# Patient Record
Sex: Female | Born: 1945 | Race: White | Hispanic: No | Marital: Married | Smoking: Never smoker
Health system: Southern US, Community
[De-identification: ages and names within clinical notes are randomized; demographics above are authoritative.]

## PROBLEM LIST (undated history)

## (undated) DIAGNOSIS — I639 Cerebral infarction, unspecified: Secondary | ICD-10-CM

## (undated) DIAGNOSIS — F200 Paranoid schizophrenia: Secondary | ICD-10-CM

## (undated) DIAGNOSIS — E7800 Pure hypercholesterolemia, unspecified: Secondary | ICD-10-CM

## (undated) DIAGNOSIS — I10 Essential (primary) hypertension: Secondary | ICD-10-CM

## (undated) DIAGNOSIS — S46009A Unspecified injury of muscle(s) and tendon(s) of the rotator cuff of unspecified shoulder, initial encounter: Secondary | ICD-10-CM

## (undated) DIAGNOSIS — C50919 Malignant neoplasm of unspecified site of unspecified female breast: Secondary | ICD-10-CM

## (undated) DIAGNOSIS — F32A Depression, unspecified: Secondary | ICD-10-CM

## (undated) DIAGNOSIS — F329 Major depressive disorder, single episode, unspecified: Secondary | ICD-10-CM

## (undated) HISTORY — DX: Cerebral infarction, unspecified: I63.9

## (undated) HISTORY — PX: MASTECTOMY: SHX3

---

## 2014-04-11 ENCOUNTER — Emergency Department (HOSPITAL_COMMUNITY): Payer: Medicare Other

## 2014-04-11 ENCOUNTER — Emergency Department (HOSPITAL_COMMUNITY)
Admission: EM | Admit: 2014-04-11 | Discharge: 2014-04-11 | Disposition: A | Discharge status: Home / Self Care | Payer: Medicare Other | Attending: Emergency Medicine | Admitting: Emergency Medicine

## 2014-04-11 ENCOUNTER — Encounter (HOSPITAL_COMMUNITY): Payer: Self-pay | Admitting: Emergency Medicine

## 2014-04-11 DIAGNOSIS — R410 Disorientation, unspecified: Secondary | ICD-10-CM | POA: Insufficient documentation

## 2014-04-11 DIAGNOSIS — Z8659 Personal history of other mental and behavioral disorders: Secondary | ICD-10-CM | POA: Insufficient documentation

## 2014-04-11 DIAGNOSIS — N390 Urinary tract infection, site not specified: Secondary | ICD-10-CM

## 2014-04-11 DIAGNOSIS — Z853 Personal history of malignant neoplasm of breast: Secondary | ICD-10-CM | POA: Insufficient documentation

## 2014-04-11 DIAGNOSIS — Z8639 Personal history of other endocrine, nutritional and metabolic disease: Secondary | ICD-10-CM | POA: Diagnosis not present

## 2014-04-11 DIAGNOSIS — I10 Essential (primary) hypertension: Secondary | ICD-10-CM | POA: Diagnosis not present

## 2014-04-11 DIAGNOSIS — R4182 Altered mental status, unspecified: Secondary | ICD-10-CM | POA: Diagnosis present

## 2014-04-11 HISTORY — DX: Major depressive disorder, single episode, unspecified: F32.9

## 2014-04-11 HISTORY — DX: Unspecified injury of muscle(s) and tendon(s) of the rotator cuff of unspecified shoulder, initial encounter: S46.009A

## 2014-04-11 HISTORY — DX: Pure hypercholesterolemia, unspecified: E78.00

## 2014-04-11 HISTORY — DX: Malignant neoplasm of unspecified site of unspecified female breast: C50.919

## 2014-04-11 HISTORY — DX: Paranoid schizophrenia: F20.0

## 2014-04-11 HISTORY — DX: Depression, unspecified: F32.A

## 2014-04-11 HISTORY — DX: Essential (primary) hypertension: I10

## 2014-04-11 LAB — URINE MICROSCOPIC-ADD ON

## 2014-04-11 LAB — CBG MONITORING, ED: Glucose-Capillary: 98 mg/dL (ref 70–99)

## 2014-04-11 LAB — BASIC METABOLIC PANEL
Anion gap: 10 (ref 5–15)
BUN: 24 mg/dL — ABNORMAL HIGH (ref 6–23)
CO2: 25 mmol/L (ref 19–32)
Calcium: 9 mg/dL (ref 8.4–10.5)
Chloride: 102 mmol/L (ref 96–112)
Creatinine, Ser: 1.29 mg/dL — ABNORMAL HIGH (ref 0.50–1.10)
GFR calc Af Amer: 48 mL/min — ABNORMAL LOW (ref >90)
GFR calc non Af Amer: 41 mL/min — ABNORMAL LOW (ref >90)
Glucose, Bld: 97 mg/dL (ref 70–99)
Potassium: 4.5 mmol/L (ref 3.5–5.1)
Sodium: 137 mmol/L (ref 135–145)

## 2014-04-11 LAB — CBC WITH DIFFERENTIAL/PLATELET
Basophils Absolute: 0.1 10*3/uL (ref 0.0–0.1)
Basophils Relative: 1 % (ref 0–1)
Eosinophils Absolute: 0.1 10*3/uL (ref 0.0–0.7)
Eosinophils Relative: 2 % (ref 0–5)
HCT: 39.8 % (ref 36.0–46.0)
Hemoglobin: 13.5 g/dL (ref 12.0–15.0)
Lymphocytes Relative: 13 % (ref 12–46)
Lymphs Abs: 1.2 10*3/uL (ref 0.7–4.0)
MCH: 29.2 pg (ref 26.0–34.0)
MCHC: 33.9 g/dL (ref 30.0–36.0)
MCV: 86.1 fL (ref 78.0–100.0)
Monocytes Absolute: 0.5 10*3/uL (ref 0.1–1.0)
Monocytes Relative: 6 % (ref 3–12)
Neutro Abs: 7 10*3/uL (ref 1.7–7.7)
Neutrophils Relative %: 78 % — ABNORMAL HIGH (ref 43–77)
Platelets: 469 10*3/uL — ABNORMAL HIGH (ref 150–400)
RBC: 4.62 MIL/uL (ref 3.87–5.11)
RDW: 15.5 % (ref 11.5–15.5)
WBC: 8.8 10*3/uL (ref 4.0–10.5)

## 2014-04-11 LAB — URINALYSIS, ROUTINE W REFLEX MICROSCOPIC
Bilirubin Urine: NEGATIVE
Glucose, UA: NEGATIVE mg/dL
Hgb urine dipstick: NEGATIVE
Ketones, ur: 15 mg/dL — AB
Nitrite: NEGATIVE
Protein, ur: NEGATIVE mg/dL
Specific Gravity, Urine: 1.016 (ref 1.005–1.030)
Urobilinogen, UA: 0.2 mg/dL (ref 0.0–1.0)
pH: 6.5 (ref 5.0–8.0)

## 2014-04-11 LAB — LACTIC ACID, PLASMA: Lactic Acid, Venous: 0.9 mmol/L (ref 0.5–2.0)

## 2014-04-11 MED ORDER — CEPHALEXIN 500 MG PO CAPS: 500.0000 mg | ORAL_CAPSULE | Freq: Four times a day (QID) | ORAL | Status: DC

## 2014-04-11 MED ORDER — CEPHALEXIN 250 MG PO CAPS
500.0000 mg | ORAL_CAPSULE | Freq: Once | ORAL | Status: AC
  Administered 2014-04-11: 500 mg via ORAL
  Filled 2014-04-11: qty 2

## 2014-04-11 NOTE — ED Notes (Signed)
Patient's husband brought patient in today for altered mental status and unsteady gait.   Patient was started on new medications on Wed for rotator cuff issues, and started having problems approximately 1 1/2 - 2 hours later.   Patient's husband states patient became disoriented, confused, and had additional problems with gait.   Husband advised "she has never been real steady on her feet, but she is additionally unsteady now".   Patient alert and oriented to person, and place only.

## 2014-04-11 NOTE — ED Notes (Signed)
Pt placed on bedpan with no success.

## 2014-04-11 NOTE — Discharge Instructions (Signed)

## 2014-04-11 NOTE — ED Provider Notes (Signed)
CSN: [id number]     Arrival date & time 04/11/14  4193 History   First MD Initiated Contact with Patient 04/11/14 [phone]     Chief Complaint  Patient presents with  . Altered Mental Status     (Consider location/radiation/quality/duration/timing/severity/associated sxs/prior Treatment) HPI   68yF with changes in mental status. Hx primarily from husband. First noticed around lunch time on Wednesday. Forgetful. Taking evening medications in morning which is unusual. This morning tried putting underwear on over her pants. Started on meloxicam and tramadol on Monday for L shoulder pain. Husband felt this may be SE of medications so has not taken either since Wednesday but symptoms have persisted. No recent medication changes otherwise. On sedating medication for psychiatric reasons and normally sleeps 10-12 hours. Last night did not sleep much at all and kept getting up. Normally somewhat unsteady at baseline but in past two days "has been unsteadier." No recent fall/trauma though. Appetite has been fine. No v/d. No fever. No urinary complaints. Pt has not voiced recent complaints aside from L shoulder pain.   Past Medical History  Diagnosis Date  . Hypertension   . Depression   . Paranoid schizophrenia   . Breast cancer   . Rotator cuff injury     left  . Hypercholesterolemia    Past Surgical History  Procedure Laterality Date  . Mastectomy     No family history on file. History  Substance Use Topics  . Smoking status: Never Smoker   . Smokeless tobacco: Not on file  . Alcohol Use: No   OB History    No data available     Review of Systems  All systems reviewed and negative, other than as noted in HPI.   Allergies  Review of patient's allergies indicates no known allergies.  Home Medications   Prior to Admission medications   Not on File   BP 160/75 mmHg  Pulse 85  Temp(Src) 97.5 F (36.4 C) (Oral)  Resp 16  SpO2 97% Physical Exam  Constitutional: She appears  well-developed and well-nourished. No distress.  HENT:  Head: Normocephalic and atraumatic.  Eyes: Conjunctivae and EOM are normal. Pupils are equal, round, and reactive to light. Right eye exhibits no discharge. Left eye exhibits no discharge.  Neck: Neck supple.  No nuchal rigidity  Cardiovascular: Normal rate, regular rhythm and normal heart sounds.  Exam reveals no gallop and no friction rub.   No murmur heard. Pulmonary/Chest: Effort normal and breath sounds normal. No respiratory distress.  Abdominal: Soft. She exhibits no distension. There is no tenderness.  Musculoskeletal: She exhibits no edema or tenderness.  Neurological: She is alert.  Disoriented to time. Speech is slow but understandable. Looks to her husband to answer questions but will answer herself when being very direct. Follows commands. CN 2-12 intact. Intention tremor but husband reports not new. Strength 5/5 b/l u/l extremities.   Skin: Skin is warm and dry.  Psychiatric: She has a normal mood and affect. Her behavior is normal. Thought content normal.  Nursing note and vitals reviewed.   ED Course  Procedures (including critical care time) Labs Review Labs Reviewed  CBC WITH DIFFERENTIAL/PLATELET - Abnormal; Notable for the following:    Platelets 469 (*)    Neutrophils Relative % 78 (*)    All other components within normal limits  BASIC METABOLIC PANEL - Abnormal; Notable for the following:    BUN 24 (*)    Creatinine, Ser 1.29 (*)    GFR calc non  Af Amer 41 (*)    GFR calc Af Amer 48 (*)    All other components within normal limits  URINALYSIS, ROUTINE W REFLEX MICROSCOPIC - Abnormal; Notable for the following:    Ketones, ur 15 (*)    Leukocytes, UA MODERATE (*)    All other components within normal limits  URINE MICROSCOPIC-ADD ON - Abnormal; Notable for the following:    Squamous Epithelial / LPF FEW (*)    Bacteria, UA FEW (*)    Casts HYALINE CASTS (*)    All other components within normal  limits  URINE CULTURE  LACTIC ACID, PLASMA    Imaging Review Ct Head Wo Contrast  04/11/2014   CLINICAL DATA:  Altered mental status. Unsteady gait. Medication change 2 days ago.  EXAM: CT HEAD WITHOUT CONTRAST  TECHNIQUE: Contiguous axial images were obtained from the base of the skull through the vertex without intravenous contrast.  COMPARISON:  None.  FINDINGS: The brain does not show generalized atrophy. The brainstem and cerebellum are normal. There are a few areas of low density in the basal ganglia consistent with old small vessel infarctions. No sign of acute infarction, mass lesion, hemorrhage, hydrocephalus or extra-axial collection. The calvarium is unremarkable. Sinuses, middle ears and mastoids are clear.  IMPRESSION: No acute finding. 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   Electronically Signed   By: Nelson Chimes M.D.   On: 04/11/2014 10:46     EKG Interpretation   Date/Time:  Friday April 11 2014 09:43:22 EST Ventricular Rate:  86 PR Interval:  161 QRS Duration: 88 QT Interval:  380 QTC Calculation: 454 R Axis:   71 Text Interpretation:  Sinus rhythm Poor R wave progression No old tracing  to compare Confirmed by Adel  MD, Gaddiel Cullens ([id number]) on 04/11/2014 9:50:16 AM      MDM   Final diagnoses:  Disorientation  UTI (lower urinary tract infection)    68yF with change in mentation. Afebrile. HD stable. Potentially UTI?  Few bacteria on UA but also some epithelial cell. Moderate leuks, 7-10 WBC. No specific urinary complaints, but with change in mental status, will prescribe abx. Urine culture. No focal neuro deficit.  Potentially medication reaction/interaction. Recently started on tramadol/mobic for L shoulder pain. If anything, possibly additional of tramadol. Recommended stopping for another few days.    Virgel Manifold, MD 04/12/14 [phone]

## 2014-04-12 LAB — URINE CULTURE
Colony Count: NO GROWTH
Culture: NO GROWTH

## 2016-07-09 ENCOUNTER — Observation Stay (HOSPITAL_COMMUNITY): Payer: Medicare Other

## 2016-07-09 ENCOUNTER — Observation Stay (HOSPITAL_COMMUNITY)
Admission: EM | Admit: 2016-07-09 | Discharge: 2016-07-10 | Disposition: A | Discharge status: Home / Self Care | Payer: Medicare Other | Attending: Family Medicine | Admitting: Family Medicine

## 2016-07-09 ENCOUNTER — Encounter (HOSPITAL_COMMUNITY): Payer: Self-pay | Admitting: Emergency Medicine

## 2016-07-09 DIAGNOSIS — R4781 Slurred speech: Secondary | ICD-10-CM | POA: Insufficient documentation

## 2016-07-09 DIAGNOSIS — G459 Transient cerebral ischemic attack, unspecified: Principal | ICD-10-CM | POA: Insufficient documentation

## 2016-07-09 DIAGNOSIS — I10 Essential (primary) hypertension: Secondary | ICD-10-CM | POA: Insufficient documentation

## 2016-07-09 DIAGNOSIS — Z8673 Personal history of transient ischemic attack (TIA), and cerebral infarction without residual deficits: Secondary | ICD-10-CM | POA: Diagnosis not present

## 2016-07-09 DIAGNOSIS — Z9010 Acquired absence of unspecified breast and nipple: Secondary | ICD-10-CM | POA: Insufficient documentation

## 2016-07-09 DIAGNOSIS — E7800 Pure hypercholesterolemia, unspecified: Secondary | ICD-10-CM | POA: Diagnosis not present

## 2016-07-09 DIAGNOSIS — R29810 Facial weakness: Secondary | ICD-10-CM | POA: Insufficient documentation

## 2016-07-09 DIAGNOSIS — Z79899 Other long term (current) drug therapy: Secondary | ICD-10-CM | POA: Insufficient documentation

## 2016-07-09 DIAGNOSIS — Z853 Personal history of malignant neoplasm of breast: Secondary | ICD-10-CM | POA: Diagnosis not present

## 2016-07-09 DIAGNOSIS — F32A Depression, unspecified: Secondary | ICD-10-CM

## 2016-07-09 DIAGNOSIS — F329 Major depressive disorder, single episode, unspecified: Secondary | ICD-10-CM | POA: Diagnosis not present

## 2016-07-09 DIAGNOSIS — Z7982 Long term (current) use of aspirin: Secondary | ICD-10-CM | POA: Diagnosis not present

## 2016-07-09 DIAGNOSIS — F200 Paranoid schizophrenia: Secondary | ICD-10-CM | POA: Diagnosis present

## 2016-07-09 DIAGNOSIS — R200 Anesthesia of skin: Secondary | ICD-10-CM | POA: Diagnosis not present

## 2016-07-09 LAB — COMPREHENSIVE METABOLIC PANEL
ALT: 20 U/L (ref 14–54)
ANION GAP: 7 (ref 5–15)
AST: 21 U/L (ref 15–41)
Albumin: 3.5 g/dL (ref 3.5–5.0)
Alkaline Phosphatase: 79 U/L (ref 38–126)
BUN: 15 mg/dL (ref 6–20)
CHLORIDE: 104 mmol/L (ref 101–111)
CO2: 26 mmol/L (ref 22–32)
CREATININE: 1.1 mg/dL — AB (ref 0.44–1.00)
Calcium: 8.8 mg/dL — ABNORMAL LOW (ref 8.9–10.3)
GFR calc non Af Amer: 49 mL/min — ABNORMAL LOW (ref >60)
GFR, EST AFRICAN AMERICAN: 57 mL/min — AB (ref >60)
Glucose, Bld: 99 mg/dL (ref 65–99)
POTASSIUM: 4.1 mmol/L (ref 3.5–5.1)
SODIUM: 137 mmol/L (ref 135–145)
Total Bilirubin: 0.5 mg/dL (ref 0.3–1.2)
Total Protein: 6.7 g/dL (ref 6.5–8.1)

## 2016-07-09 LAB — I-STAT CHEM 8, ED
BUN: 18 mg/dL (ref 6–20)
Calcium, Ion: 1.13 mmol/L — ABNORMAL LOW (ref 1.15–1.40)
Chloride: 101 mmol/L (ref 101–111)
Creatinine, Ser: 1.2 mg/dL — ABNORMAL HIGH (ref 0.44–1.00)
Glucose, Bld: 95 mg/dL (ref 65–99)
HEMATOCRIT: 44 % (ref 36.0–46.0)
HEMOGLOBIN: 15 g/dL (ref 12.0–15.0)
POTASSIUM: 4.2 mmol/L (ref 3.5–5.1)
SODIUM: 140 mmol/L (ref 135–145)
TCO2: 29 mmol/L (ref 0–100)

## 2016-07-09 LAB — PROTIME-INR
INR: 1.11
PROTHROMBIN TIME: 14.3 s (ref 11.4–15.2)

## 2016-07-09 LAB — CBC
HCT: 42.3 % (ref 36.0–46.0)
Hemoglobin: 13.8 g/dL (ref 12.0–15.0)
MCH: 27.8 pg (ref 26.0–34.0)
MCHC: 32.6 g/dL (ref 30.0–36.0)
MCV: 85.1 fL (ref 78.0–100.0)
PLATELETS: 535 10*3/uL — AB (ref 150–400)
RBC: 4.97 MIL/uL (ref 3.87–5.11)
RDW: 16.5 % — AB (ref 11.5–15.5)
WBC: 9.3 10*3/uL (ref 4.0–10.5)

## 2016-07-09 LAB — DIFFERENTIAL
BASOS PCT: 1 %
Basophils Absolute: 0.1 10*3/uL (ref 0.0–0.1)
Eosinophils Absolute: 0.1 10*3/uL (ref 0.0–0.7)
Eosinophils Relative: 1 %
Lymphocytes Relative: 17 %
Lymphs Abs: 1.6 10*3/uL (ref 0.7–4.0)
MONO ABS: 0.5 10*3/uL (ref 0.1–1.0)
Monocytes Relative: 6 %
NEUTROS ABS: 7 10*3/uL (ref 1.7–7.7)
Neutrophils Relative %: 75 %

## 2016-07-09 LAB — I-STAT TROPONIN, ED: Troponin i, poc: 0.01 ng/mL (ref 0.00–0.08)

## 2016-07-09 LAB — APTT: aPTT: 32 seconds (ref 24–36)

## 2016-07-09 LAB — CBG MONITORING, ED: Glucose-Capillary: 101 mg/dL — ABNORMAL HIGH (ref 65–99)

## 2016-07-09 MED ORDER — ATORVASTATIN CALCIUM 20 MG PO TABS
20.0000 mg | ORAL_TABLET | ORAL | Status: DC
  Administered 2016-07-10: 20 mg via ORAL
  Filled 2016-07-09: qty 2
  Filled 2016-07-09: qty 1
  Filled 2016-07-09: qty 2

## 2016-07-09 MED ORDER — ASPIRIN 300 MG RE SUPP
300.0000 mg | Freq: Every day | RECTAL | Status: DC
Start: 2016-07-09 — End: 2016-07-10
  Filled 2016-07-09: qty 1

## 2016-07-09 MED ORDER — ATORVASTATIN CALCIUM 40 MG PO TABS: 40.0000 mg | ORAL_TABLET | ORAL | Status: DC

## 2016-07-09 MED ORDER — VENLAFAXINE HCL ER 75 MG PO CP24
225.0000 mg | ORAL_CAPSULE | Freq: Every day | ORAL | Status: DC
  Administered 2016-07-10: 225 mg via ORAL
  Filled 2016-07-09: qty 1

## 2016-07-09 MED ORDER — HYDRALAZINE HCL 20 MG/ML IJ SOLN
20.0000 mg | INTRAMUSCULAR | Status: DC | PRN
  Administered 2016-07-10: 20 mg via INTRAVENOUS
  Filled 2016-07-09 (×2): qty 1

## 2016-07-09 MED ORDER — HALOPERIDOL 2 MG PO TABS
3.0000 mg | ORAL_TABLET | Freq: Every day | ORAL | Status: DC
  Administered 2016-07-09: 3 mg via ORAL
  Filled 2016-07-09 (×5): qty 1

## 2016-07-09 MED ORDER — METOPROLOL TARTRATE 25 MG PO TABS
25.0000 mg | ORAL_TABLET | Freq: Two times a day (BID) | ORAL | Status: DC
  Administered 2016-07-09 – 2016-07-10 (×3): 25 mg via ORAL
  Filled 2016-07-09 (×3): qty 1

## 2016-07-09 MED ORDER — HYDRALAZINE HCL 20 MG/ML IJ SOLN
20.0000 mg | Freq: Once | INTRAMUSCULAR | Status: AC
  Administered 2016-07-09: 20 mg via INTRAVENOUS

## 2016-07-09 MED ORDER — ASPIRIN 325 MG PO TABS
325.0000 mg | ORAL_TABLET | Freq: Every day | ORAL | Status: DC
  Administered 2016-07-10: 325 mg via ORAL
  Filled 2016-07-09: qty 1

## 2016-07-09 MED ORDER — LORAZEPAM 0.5 MG PO TABS
0.5000 mg | ORAL_TABLET | Freq: Every day | ORAL | Status: DC
  Administered 2016-07-09: 0.5 mg via ORAL
  Filled 2016-07-09: qty 1

## 2016-07-09 MED ORDER — METOPROLOL TARTRATE 5 MG/5ML IV SOLN
5.0000 mg | Freq: Once | INTRAVENOUS | Status: AC
  Administered 2016-07-09: 5 mg via INTRAVENOUS
  Filled 2016-07-09: qty 5

## 2016-07-09 MED ORDER — PANTOPRAZOLE SODIUM 40 MG PO TBEC
40.0000 mg | DELAYED_RELEASE_TABLET | Freq: Every day | ORAL | Status: DC
  Administered 2016-07-09 – 2016-07-10 (×2): 40 mg via ORAL
  Filled 2016-07-09 (×2): qty 1

## 2016-07-09 MED ORDER — STROKE: EARLY STAGES OF RECOVERY BOOK
Freq: Once | Status: AC
  Administered 2016-07-09: 19:00:00
  Filled 2016-07-09: qty 1

## 2016-07-09 MED ORDER — LOSARTAN POTASSIUM 50 MG PO TABS
50.0000 mg | ORAL_TABLET | Freq: Two times a day (BID) | ORAL | Status: DC
  Administered 2016-07-09 – 2016-07-10 (×2): 50 mg via ORAL
  Filled 2016-07-09 (×2): qty 1

## 2016-07-09 MED ORDER — BENZTROPINE MESYLATE 1 MG PO TABS
1.0000 mg | ORAL_TABLET | Freq: Two times a day (BID) | ORAL | Status: DC
  Administered 2016-07-09 – 2016-07-10 (×2): 1 mg via ORAL
  Filled 2016-07-09: qty 2
  Filled 2016-07-09: qty 1
  Filled 2016-07-09: qty 2
  Filled 2016-07-09 (×2): qty 1

## 2016-07-09 MED ORDER — SENNOSIDES-DOCUSATE SODIUM 8.6-50 MG PO TABS
3.0000 | ORAL_TABLET | Freq: Every day | ORAL | Status: DC
  Administered 2016-07-09: 3 via ORAL
  Filled 2016-07-09 (×2): qty 3

## 2016-07-09 NOTE — ED Notes (Signed)
Pt's husband states that patient DID TAKE her home meds this morning because they were not in pill box.  When patient was brought in by EMS, EMS said that she stated that she DID NOT take her morning meds.

## 2016-07-09 NOTE — ED Notes (Signed)
EDP at bedside  

## 2016-07-09 NOTE — ED Provider Notes (Signed)
[facility] DEPT Provider Note   CSN: [id number] Arrival date & time: 07/09/16  1432     History   Chief Complaint Chief Complaint  Patient presents with  . Extremity Weakness    HPI Donna Reed is a 71 y.o. female.  HPI  Patient with a PMH of HTN and HLD presents with ~15-20 min episode of slurred speech, left sided facial droop, facial numbness, and UE and LE numbness that began earlier today and witnessed by husband. Patient states that symptoms have resolved since then. Husband states that patient appears back to baseline. She denies prior history of CVA, TIA or similar episode in the past. Reports being able to walk normally since then and denies any residual numbness, weakness or vision changes. Denies any nausea, vomiting, diarrhea, lightheadedness, syncope, head injury, headache, chest pain, trouble breathing, fever.  Past Medical History:  Diagnosis Date  . Breast cancer ([place])   . Depression   . Hypercholesterolemia   . Hypertension   . Paranoid schizophrenia ([place])   . Rotator cuff injury    left    Patient Active Problem List   Diagnosis Date Noted  . TIA (transient ischemic attack) 07/09/2016  . Depression 07/09/2016  . Essential hypertension 07/09/2016  . Paranoid schizophrenia ([place]) 07/09/2016    Past Surgical History:  Procedure Laterality Date  . MASTECTOMY      OB History    No data available       Home Medications    Prior to Admission medications   Medication Sig Start Date End Date Taking? Authorizing Provider  atorvastatin (LIPITOR) 40 MG tablet Take 20-40 mg by mouth See admin instructions. Take 1 tablet (40 mg) by mouth at bedtime on even numbered days, take 1/2 tablet (20 mg) at bedtime on odd numbered days   Yes [provider]  benztropine (COGENTIN) 1 MG tablet Take 1 mg by mouth 2 (two) times daily.   Yes [provider]  esomeprazole (NEXIUM) 40 MG capsule Take 40 mg by mouth daily before lunch.    Yes  [provider]  haloperidol (HALDOL) 2 MG tablet Take 3 mg by mouth at bedtime.    Yes [provider]  LORazepam (ATIVAN) 0.5 MG tablet Take 0.5 mg by mouth at bedtime.    Yes [provider]  losartan (COZAAR) 100 MG tablet Take 50 mg by mouth 2 (two) times daily.   Yes [provider]  sennosides-docusate sodium (SENOKOT-S) 8.6-50 MG tablet Take 3 tablets by mouth at bedtime.    Yes [provider]  venlafaxine XR (EFFEXOR-XR) 75 MG 24 hr capsule Take 225 mg by mouth daily with breakfast.   Yes [provider]    Family History History reviewed. No pertinent family history.  Social History Social History  Substance Use Topics  . Smoking status: Never Smoker  . Smokeless tobacco: Not on file  . Alcohol use No     Allergies   Patient has no known allergies.   Review of Systems Review of Systems  Constitutional: Negative for appetite change, chills and fever.  HENT: Negative for rhinorrhea and sneezing.   Eyes: Negative for photophobia and visual disturbance.  Respiratory: Negative for cough, chest tightness, shortness of breath and wheezing.   Cardiovascular: Negative for chest pain and palpitations.  Gastrointestinal: Negative for abdominal pain, blood in stool, constipation, diarrhea, nausea and vomiting.  Genitourinary: Negative for dysuria.  Musculoskeletal: Positive for gait problem. Negative for myalgias.  Skin: Negative for rash.  Neurological: Positive for facial asymmetry, weakness and numbness. Negative for dizziness and light-headedness.     Physical Exam Updated Vital Signs BP (!) 193/64   Pulse 62   Temp 98.5 F (36.9 C)   Resp (!) 22   Ht 5\' 6"  (1.676 m)   Wt 68.9 kg (152 lb)   SpO2 96%   BMI 24.53 kg/m   Physical Exam  Constitutional: She is oriented to person, place, and time. She appears well-developed and well-nourished. No distress.  Slightly slurred speech (which has been states is her  baseline).  HENT:  Head: Normocephalic and atraumatic.  Nose: Nose normal.  Eyes: Conjunctivae and EOM are normal. Pupils are equal, round, and reactive to light. Right eye exhibits no discharge. Left eye exhibits no discharge. No scleral icterus.  Neck: Normal range of motion. Neck supple.  Cardiovascular: Normal rate, regular rhythm, normal heart sounds and intact distal pulses.  Exam reveals no gallop and no friction rub.   No murmur heard. Pulmonary/Chest: Effort normal and breath sounds normal. No respiratory distress.  Abdominal: Soft. Bowel sounds are normal. She exhibits no distension. There is no tenderness. There is no guarding.  Musculoskeletal: Normal range of motion. She exhibits no edema.  Neurological: She is alert and oriented to person, place, and time. She has normal strength and normal reflexes. No cranial nerve deficit or sensory deficit. She exhibits normal muscle tone. Coordination normal.  Skin: Skin is warm and dry. No rash noted.  Psychiatric: She has a normal mood and affect.  Nursing note and vitals reviewed.    ED Treatments / Results  Labs (all labs ordered are listed, but only abnormal results are displayed) Labs Reviewed  CBC - Abnormal; Notable for the following:       Result Value   RDW 16.5 (*)    Platelets 535 (*)    All other components within normal limits  COMPREHENSIVE METABOLIC PANEL - Abnormal; Notable for the following:    Creatinine, Ser 1.10 (*)    Calcium 8.8 (*)    GFR calc non Af Amer 49 (*)    GFR calc Af Amer 57 (*)    All other components within normal limits  CBG MONITORING, ED - Abnormal; Notable for the following:    Glucose-Capillary 101 (*)    All other components within normal limits  I-STAT CHEM 8, ED - Abnormal; Notable for the following:    Creatinine, Ser 1.20 (*)    Calcium, Ion 1.13 (*)    All other components within normal limits  PROTIME-INR  APTT  DIFFERENTIAL  I-STAT TROPOININ, ED    EKG  EKG  Interpretation None       Radiology No results found.  Procedures Procedures (including critical care time)  Medications Ordered in ED Medications   stroke: mapping our early stages of recovery book (not administered)  aspirin suppository 300 mg (not administered)    Or  aspirin tablet 325 mg (not administered)  atorvastatin (LIPITOR) tablet 20-40 mg (not administered)  benztropine (COGENTIN) tablet 1 mg (not administered)  pantoprazole (PROTONIX) EC tablet 40 mg (not administered)  haloperidol (HALDOL) tablet 3 mg (not administered)  LORazepam (ATIVAN) tablet 0.5 mg (not administered)  losartan (COZAAR) tablet 50 mg (not administered)  sennosides-docusate sodium (SENOKOT-S) 8.6-50 MG tablet 3 tablet (not administered)  venlafaxine XR (EFFEXOR-XR) 24 hr capsule 225 mg (not administered)     Initial Impression / Assessment and Plan / ED Course  I have reviewed the triage vital signs  and the nursing notes.  Pertinent labs & imaging results that were available during my care of the patient were reviewed by me and considered in my medical decision making (see chart for details).     Patient's history and symptoms concerning for TIA. Due to patient's risk factors and duration of symptoms, her ABCD2 score is 4 which makes her moderate risk for stroke in the next 2-7 days. CBC, BMP, troponin unremarkable today with no electrolyte abnormalities present or signs of infection. EKG shows NSR. Patient denies chest pain, trouble breathing. Due to her risk of adverse event in the next 2-7 days, will admit for observation. Spoke to Dr. Evangeline Gula from hospitalist team who will admit for observation.  Patient discussed with and seen by Dr. Kathrynn Humble.  Final Clinical Impressions(s) / ED Diagnoses   Final diagnoses:  None    New Prescriptions New Prescriptions   No medications on file     Delia Heady, PA-C 07/09/16 [place], [place], MD 07/10/16 1535

## 2016-07-09 NOTE — ED Triage Notes (Signed)
Per EMS, patient's home came in from mowing at 13 and said that his wife was slurring words, had right sided facial droop, and unable to move right arm or leg.  Upon EMS arrival, all symptoms resolved.  A/O x 4.  No hx of TIA or stroke.  Significant psych hx.  Has not taken any meds today for psych or hypertension.  NIH 0.  All VSS.  CBG 80,170/81, HR 65, 97% RA.

## 2016-07-09 NOTE — ED Notes (Signed)
Attempted report 

## 2016-07-09 NOTE — H&P (Signed)
History and Physical    Donna Reed RAQ:[id number] DOB: 1945/07/08 DOA: 07/09/2016  PCP: Patient, No Pcp Per (Confirm with patient/family/NH records and if not entered, this has to be entered at [facility] point of entry) Patient coming from: Home  I have personally briefly reviewed patient's old medical records in [place]  Chief Complaint: 15-20 minute episode of slurred speech and left-sided weakness today  HPI: Donna Reed is a 71 y.o. female with medical history significant of HTN and HLD presents with ~15-20 min episode of slurred speech, left sided facial droop, facial numbness, and UE and LE numbness that began earlier today and witnessed by husband. Patient states that symptoms have resolved since then. Husband states that patient appears back to baseline. She denies prior history of CVA, TIA or similar episode in the past. Reports being able to walk normally since then and denies any residual numbness, weakness or vision changes. Denies any nausea, vomiting, diarrhea, lightheadedness, syncope, head injury, headache, chest pain, trouble breathing, fever.   ED Course: Patient's history and symptoms concerning for TIA. Due to patient's risk factors and duration of symptoms, her ABCD2 score is 4 which makes her moderate risk for stroke in the next 2-7 days. CBC, BMP, troponin unremarkable today with no electrolyte abnormalities present. EKG shows NSR. Patient denies chest pain, trouble breathing.   Review of Systems: As per HPI otherwise 10 point review of systems negative.   Review of Systems  Constitutional: Negative for chills and fever.  HENT: Positive for hearing loss. Negative for tinnitus.   Eyes: Negative for blurred vision and double vision.  Respiratory: Negative for cough and hemoptysis.   Cardiovascular: Negative for chest pain and palpitations.  Gastrointestinal: Negative for heartburn and nausea.  Genitourinary: Negative for dysuria and urgency.    Musculoskeletal: Negative for myalgias and neck pain.  Skin: Negative for itching and rash.  Neurological: Negative for dizziness, weakness and headaches.  Endo/Heme/Allergies: Negative for environmental allergies. Does not bruise/bleed easily.  Psychiatric/Behavioral: Negative for depression and suicidal ideas.   Past Medical History:  Diagnosis Date  . Breast cancer ([place])   . Depression   . Hypercholesterolemia   . Hypertension   . Paranoid schizophrenia ([place])   . Rotator cuff injury    left    Past Surgical History:  Procedure Laterality Date  . MASTECTOMY       reports that she has never smoked. She does not have any smokeless tobacco history on file. She reports that she does not drink alcohol or use drugs.  No Known Allergies  History reviewed. No pertinent family history.   Prior to Admission medications   Medication Sig Start Date End Date Taking? Authorizing Provider  atorvastatin (LIPITOR) 40 MG tablet Take 20-40 mg by mouth See admin instructions. Take 1 tablet (40 mg) by mouth at bedtime on even numbered days, take 1/2 tablet (20 mg) at bedtime on odd numbered days   Yes [provider]  benztropine (COGENTIN) 1 MG tablet Take 1 mg by mouth 2 (two) times daily.   Yes [provider]  esomeprazole (NEXIUM) 40 MG capsule Take 40 mg by mouth daily before lunch.    Yes [provider]  haloperidol (HALDOL) 2 MG tablet Take 3 mg by mouth at bedtime.    Yes [provider]  LORazepam (ATIVAN) 0.5 MG tablet Take 0.5 mg by mouth at bedtime.    Yes [provider]  losartan (COZAAR) 100 MG tablet Take 50 mg by mouth  2 (two) times daily.   Yes [provider]  sennosides-docusate sodium (SENOKOT-S) 8.6-50 MG tablet Take 3 tablets by mouth at bedtime.    Yes [provider]  venlafaxine XR (EFFEXOR-XR) 75 MG 24 hr capsule Take 225 mg by mouth daily with breakfast.   Yes [provider]    Physical  Exam: Vitals:   07/09/16 1445 07/09/16 1500 07/09/16 1528 07/09/16 1530  BP: (!) 182/71 (!) 185/67  (!) 185/69  Pulse: 64 61  60  Resp: 17 18  17   Temp:   98.5 F (36.9 C)   TempSrc:      SpO2: 99% 95%  96%  Weight:      Height:        Constitutional: NAD, calm, comfortable Vitals:   07/09/16 1445 07/09/16 1500 07/09/16 1528 07/09/16 1530  BP: (!) 182/71 (!) 185/67  (!) 185/69  Pulse: 64 61  60  Resp: 17 18  17   Temp:   98.5 F (36.9 C)   TempSrc:      SpO2: 99% 95%  96%  Weight:      Height:       Eyes: PERRL, lids and conjunctivae normal ENMT: Mucous membranes are moist. Posterior pharynx clear of any exudate or lesions.Normal dentition.  Neck: normal, supple, no masses, no thyromegaly Respiratory: clear to auscultation bilaterally, no wheezing, no crackles. Normal respiratory effort. No accessory muscle use.  Cardiovascular: Regular rate and rhythm, no murmurs / rubs / gallops. No extremity edema. 2+ pedal pulses. No carotid bruits.  Abdomen: no tenderness, no masses palpated. No hepatosplenomegaly. Bowel sounds positive.  Musculoskeletal: no clubbing / cyanosis. No joint deformity upper and lower extremities. Good ROM, no contractures. Normal muscle tone.  Skin: no rashes, lesions, ulcers. No induration Neurologic: CN 2-12 grossly intact. Sensation intact, DTR normal. Strength 5/5 in all 4. She does have slight delay of her smile on the right however husband states that this is normal for her and not any change. Psychiatric: Normal judgment and insight. Alert and oriented x 3. Normal mood.    Labs on Admission: I have personally reviewed following labs and imaging studies  CBC:  Recent Labs Lab 07/09/16 1439 07/09/16 1453  WBC 9.3  --   NEUTROABS 7.0  --   HGB 13.8 15.0  HCT 42.3 44.0  MCV 85.1  --   PLT 535*  --    Basic Metabolic Panel:  Recent Labs Lab 07/09/16 1439 07/09/16 1453  NA 137 140  K 4.1 4.2  CL 104 101  CO2 26  --   GLUCOSE 99 95   BUN 15 18  CREATININE 1.10* 1.20*  CALCIUM 8.8*  --    GFR: Estimated Creatinine Clearance: 40.3 mL/min (A) (by C-G formula based on SCr of 1.2 mg/dL (H)). Liver Function Tests:  Recent Labs Lab 07/09/16 1439  AST 21  ALT 20  ALKPHOS 79  BILITOT 0.5  PROT 6.7  ALBUMIN 3.5   No results for input(s): LIPASE, AMYLASE in the last 168 hours. No results for input(s): AMMONIA in the last 168 hours. Coagulation Profile:  Recent Labs Lab 07/09/16 1439  INR 1.11   Cardiac Enzymes: No results for input(s): CKTOTAL, CKMB, CKMBINDEX, TROPONINI in the last 168 hours. BNP (last 3 results) No results for input(s): PROBNP in the last 8760 hours. HbA1C: No results for input(s): HGBA1C in the last 72 hours. CBG:  Recent Labs Lab 07/09/16 1547  GLUCAP 101*   Lipid Profile: No results for  input(s): CHOL, HDL, LDLCALC, TRIG, CHOLHDL, LDLDIRECT in the last 72 hours. Thyroid Function Tests: No results for input(s): TSH, T4TOTAL, FREET4, T3FREE, THYROIDAB in the last 72 hours. Anemia Panel: No results for input(s): VITAMINB12, FOLATE, FERRITIN, TIBC, IRON, RETICCTPCT in the last 72 hours. Urine analysis:    Component Value Date/Time   COLORURINE YELLOW 04/11/2014 1230   APPEARANCEUR CLEAR 04/11/2014 1230   LABSPEC 1.016 04/11/2014 1230   PHURINE 6.5 04/11/2014 1230   GLUCOSEU NEGATIVE 04/11/2014 1230   HGBUR NEGATIVE 04/11/2014 1230   BILIRUBINUR NEGATIVE 04/11/2014 1230   KETONESUR 15 (A) 04/11/2014 1230   PROTEINUR NEGATIVE 04/11/2014 1230   UROBILINOGEN 0.2 04/11/2014 1230   NITRITE NEGATIVE 04/11/2014 1230   LEUKOCYTESUR MODERATE (A) 04/11/2014 1230    Radiological Exams on Admission: No results found.  EKG: Independently reviewed. Shows normal sinus rhythm  Assessment/Plan Principal Problem:   TIA (transient ischemic attack) Active Problems:   Essential hypertension   Depression   Paranoid schizophrenia ([place])    1. TIA: We'll observe patient overnight given  rapid resolution of her symptoms we'll check bilateral carotid ultrasounds. I do not think MRI is useful at this point as patient has no residual deficits. We will use aspirin 325 by mouth daily. The patient was taking 650 mg of aspirin every 12 hours on a when necessary basis but hasn't had any past couple of days. I have discussed the case with neurology on call will see the patient.  2. essential hypertension: We'll continue her losartan and order when necessary blood pressure medication for systolic blood pressure elevations. Would consider using beta blocker in this patient.  3. Depression: Continue home medications of venlafaxine.  4. Paranoid schizophrenia: Continue home medications. No evidence of decompensation.  DVT prophylaxis: SCDs Code Status: Full Family Communication: Spoke with patient husband who is present in the room on admission. Disposition Plan: Likely home in 24 hours Consults called: Neurology [place] Admission status: observation   Lady Deutscher MD [facility] Pager [phone]  If 7PM-7AM, please contact night-coverage [email or web address] Password [facility]  07/09/2016, 4:54 PM

## 2016-07-09 NOTE — ED Notes (Signed)
Admitting MD notified at this time about elevated BP.

## 2016-07-09 NOTE — ED Notes (Signed)
Patient transported to CT 

## 2016-07-09 NOTE — Consult Note (Signed)
Neurology Consultation Reason for Consult: Transient left sided weakness Referring Physician: Evangeline Gula, T  CC: Left-sided weakness  History is obtained from: Patient  HPI: Donna Reed is a 71 y.o. female who was in her normal state of health until sometime afternoon, she is not certain of the exact time of onset but does state that it started after noon. Her husband came in from mowing around 1:20 PM and found her to be slurring her words. She was brought into the emergency department, but by the time of arrival around 2:40pm, her symptoms had improved.  She states that her left-sided weakness completely improved. She feels that she is currently back to baseline.   LKW: Noon tpa given?: no, mild symptoms   ROS: A 14 point ROS was performed and is negative except as noted in the HPI.  Past Medical History:  Diagnosis Date  . Breast cancer ([place])   . Depression   . Hypercholesterolemia   . Hypertension   . Paranoid schizophrenia ([place])   . Rotator cuff injury    left     History reviewed. No pertinent family history.   Social History:  reports that she has never smoked. She does not have any smokeless tobacco history on file. She reports that she does not drink alcohol or use drugs.   Exam: Current vital signs: BP 122/73 (BP Location: Left Arm)   Pulse 76   Temp 98.6 F (37 C) (Oral)   Resp 18   Ht 5\' 6"  (1.676 m)   Wt 68.9 kg (152 lb)   SpO2 96%   BMI 24.53 kg/m  Vital signs in last 24 hours: Temp:  [98.5 F (36.9 C)-98.6 F (37 C)] 98.6 F (37 C) (06/02 1811) Pulse Rate:  [58-76] 76 (06/02 1923) Resp:  [16-22] 18 (06/02 1923) BP: (122-204)/(64-75) 122/73 (06/02 1923) SpO2:  [94 %-99 %] 96 % (06/02 1923) Weight:  [68.9 kg (152 lb)] 68.9 kg (152 lb) (06/02 1442)   Physical Exam  Constitutional: Appears well-developed and well-nourished.  Psych: Affect appropriate to situation Eyes: No scleral injection HENT: No OP obstrucion Head: Normocephalic.   Cardiovascular: Normal rate and regular rhythm.  Respiratory: Effort normal and breath sounds normal to anterior ascultation GI: Soft.  No distension. There is no tenderness.  Skin: WDI  Neuro: Mental Status: Patient is awake, alert, oriented to person, place, month, year, and situation. Patient is able to give a clear and coherent history. No signs of aphasia or neglect Cranial Nerves: II: Visual Fields are full. Pupils are equal, round, and reactive to light.   III,IV, VI: EOMI without ptosis or diploplia.  V: Facial sensation is symmetric to temperature VII: I have the impression that the left side of her face does not move quite as much as the right when she is speaking, but when asked to smile or otherwise directly test her, the 2 sides seem symmetric. VIII: hearing is intact to voice X: Uvula elevates symmetrically XI: Shoulder shrug is symmetric. XII: tongue is midline without atrophy or fasciculations.  Motor: Tone is normal. Bulk is normal. 5/5 strength was present in all four extremities.  Sensory: Sensation is symmetric to light touch and temperature in the arms and legs. Cerebellar: FNF intact bilaterally      I have reviewed labs in epic and the results pertinent to this consultation are: CMP-unremarkable  I have reviewed the images obtained: CT head-unremarkable  Impression: 71 year old female with transient left-sided weakness and numbness. I still wonder if she has  some mild left facial weakness and dysarthria, but she states that she sounds normal to herself and it is very mild. The description of the symptoms sound consistent with TIA versus mild stroke and she is being admitted for further evaluation and workup.  Recommendations: 1. HgbA1c, fasting lipid panel 2. MRI, MRA  of the brain without contrast 3. Frequent neuro checks 4. Echocardiogram 5. Carotid dopplers 6. Prophylactic therapy-Antiplatelet med: Aspirin - dose 325mg  PO or 300mg  PR 7. Risk  factor modification 8. Telemetry monitoring 9. PT consult, OT consult, Speech consult 10. please page stroke NP  Or  PA  Or MD  from 8am -4 pm as this patient will be followed by the stroke team at this point.   You can look them up on [email or web address]      Roland Rack, MD [facility] [phone]  If 7pm- 7am, please page neurology on call as listed in [place].

## 2016-07-09 NOTE — Progress Notes (Signed)
Report received from the ED at 1732 and pt arrived to the unit at 1810. Pt alert and verbally responsive; spouse remains at bedside; both oriented to the unit and room; fall/safety precaution/prevention education completed; bed alarm on; VSS; SBP remains elevated and MD notified; new orders received; pt's skin clean, dry and intact with no pressure ulcer or open wounds noted. Call light within reach and spouse remains at bedside. Will continue to monitor and report off to oncoming RN. Delia Heady RN

## 2016-07-10 ENCOUNTER — Observation Stay (HOSPITAL_COMMUNITY): Payer: Medicare Other

## 2016-07-10 ENCOUNTER — Observation Stay (HOSPITAL_BASED_OUTPATIENT_CLINIC_OR_DEPARTMENT_OTHER): Payer: Medicare Other

## 2016-07-10 ENCOUNTER — Encounter (HOSPITAL_COMMUNITY): Payer: Self-pay

## 2016-07-10 DIAGNOSIS — F200 Paranoid schizophrenia: Secondary | ICD-10-CM | POA: Diagnosis not present

## 2016-07-10 DIAGNOSIS — I10 Essential (primary) hypertension: Secondary | ICD-10-CM | POA: Diagnosis not present

## 2016-07-10 DIAGNOSIS — I351 Nonrheumatic aortic (valve) insufficiency: Secondary | ICD-10-CM | POA: Diagnosis not present

## 2016-07-10 DIAGNOSIS — G459 Transient cerebral ischemic attack, unspecified: Secondary | ICD-10-CM | POA: Diagnosis not present

## 2016-07-10 DIAGNOSIS — G451 Carotid artery syndrome (hemispheric): Secondary | ICD-10-CM

## 2016-07-10 LAB — LIPID PANEL
CHOLESTEROL: 167 mg/dL (ref 0–200)
HDL: 45 mg/dL (ref >40)
LDL Cholesterol: 105 mg/dL — ABNORMAL HIGH (ref 0–99)
TRIGLYCERIDES: 84 mg/dL (ref <150)
Total CHOL/HDL Ratio: 3.7 RATIO
VLDL: 17 mg/dL (ref 0–40)

## 2016-07-10 MED ORDER — ASPIRIN 325 MG PO TABS: 325.0000 mg | ORAL_TABLET | Freq: Every day | ORAL | 0 refills | Status: AC

## 2016-07-10 MED ORDER — ONDANSETRON HCL 4 MG/2ML IJ SOLN
4.0000 mg | Freq: Four times a day (QID) | INTRAMUSCULAR | Status: DC | PRN
  Administered 2016-07-10 (×2): 4 mg via INTRAVENOUS
  Filled 2016-07-10 (×2): qty 2

## 2016-07-10 MED ORDER — IOPAMIDOL (ISOVUE-370) INJECTION 76%
INTRAVENOUS | Status: AC
  Administered 2016-07-10: 50 mL
  Filled 2016-07-10: qty 50

## 2016-07-10 MED ORDER — METOPROLOL TARTRATE 25 MG PO TABS: 25.0000 mg | ORAL_TABLET | Freq: Two times a day (BID) | ORAL | 0 refills | Status: AC

## 2016-07-10 NOTE — Discharge Summary (Signed)
Physician Discharge Summary  Donna Reed IZT:[id number] DOB: 08-25-1945 DOA: 07/09/2016  PCP: Rolan Lipa, MD  Admit date: 07/09/2016 Discharge date: 07/11/2016  Admitted From: Home Disposition: Home   Recommendations for Outpatient Follow-up:  1. Follow up with PCP in 1-2 weeks for ongoing risk factor modification, specifically HTN. Metoprolol was started as below. 2. Follow up with Dr. Jaynee Eagles at [facility] (not previously established with neurology) in 6 weeks. See echocardiogram report, which suggests consideration TEE if highly concerned for embolic stroke.   Home Health: None Equipment/Devices: None Discharge Condition: Stable CODE STATUS: Full Diet recommendation: Heart healthy  Brief/Interim Summary: Donna Reed is a 71 y.o. female with a history of HTN and HLD who presented with transient (15-20 min) slurred speech, left sided facial droop, facial numbness, and UE and LE numbnesswitnessed by husband. Symptoms had resolved by the time of ED presentation. Initial head CT was unremarkable, and she was placed in observation for work up of mild stroke vs. TIA. Subsequent MRI showed diffuse chronic small-vessel ischemia without acute stroke. CTA head and neck demonstrated tortuous vessels without significant stenosis. Echocardiogram showed no evidence of cardioembolic source, though aortic valve was not entirely visualized. Dr. Erlinda Hong, stroke neurologist, recommended discharge with follow with neurology. She was discharge in stable condition with resolved deficits.   Discharge Diagnoses:  Principal Problem:   TIA (transient ischemic attack) Active Problems:   Depression   Essential hypertension   Paranoid schizophrenia ([place])  TIA and cerebrovascular disease: Symptoms resolved at time of arrival, have remained resolved during observation period. CT head and MRI negative for acute stroke. Widespread small-vessel ischemic changes were noted, however. CTA head/neck notable for only mild  atherosclerotic disease without occlusion. Mild stenotic disease in right PCA and tortuous vessels.  - Stroke neurologist recommends antiplatelet: full dose aspirin daily (previously taking aspirin only prn) - Continue lipitor as tolerated. LDL 105 - HbA1c pending at discharge  Essential hypertension:  - Continue losartan.  - Outpatient follow up required for ongoing management of this significant risk factor.   Depression and paranoid schizophrenia: No acute issues - Continue home medications  Discharge Instructions Discharge Instructions    Ambulatory referral to Neurology    Complete by:  As directed    An appointment is requested in approximately: 6 weeks   Diet - low sodium heart healthy    Complete by:  As directed    Discharge instructions    Complete by:  As directed    You were admitted for a TIA, a transient ischemic attack, and have undergone a work up for stroke. The work up has not indicated a specific reversible cause of your symptoms, and no stroke was seen on imaging. You are stable for discharge with the following recommendations:  - Start taking aspirin 325mg  daily - Follow up with your PCP to recheck your blood pressure. You may need more medication to get to your goal blood pressure.  - Follow up with neurology in about 8 weeks. You will be contacted regarding this appointment.  - If your symptoms return, seek medical attention right away.     Allergies as of 07/10/2016   No Known Allergies     Medication List    TAKE these medications   aspirin 325 MG tablet Take 1 tablet (325 mg total) by mouth daily.   atorvastatin 40 MG tablet Commonly known as:  LIPITOR Take 20-40 mg by mouth See admin instructions. Take 1 tablet (40 mg) by mouth at bedtime on even numbered  days, take 1/2 tablet (20 mg) at bedtime on odd numbered days   benztropine 1 MG tablet Commonly known as:  COGENTIN Take 1 mg by mouth 2 (two) times daily.   esomeprazole 40 MG  capsule Commonly known as:  NEXIUM Take 40 mg by mouth daily before lunch.   haloperidol 2 MG tablet Commonly known as:  HALDOL Take 3 mg by mouth at bedtime.   LORazepam 0.5 MG tablet Commonly known as:  ATIVAN Take 0.5 mg by mouth at bedtime.   losartan 100 MG tablet Commonly known as:  COZAAR Take 50 mg by mouth 2 (two) times daily.   metoprolol tartrate 25 MG tablet Commonly known as:  LOPRESSOR Take 1 tablet (25 mg total) by mouth 2 (two) times daily.   sennosides-docusate sodium 8.6-50 MG tablet Commonly known as:  SENOKOT-S Take 3 tablets by mouth at bedtime.   venlafaxine XR 75 MG 24 hr capsule Commonly known as:  EFFEXOR-XR Take 225 mg by mouth daily with breakfast.      Follow-up Information    [facility] Follow up.   Specialty:  Cardiology Contact information: [address] [id number] [place] [facility] Follow up.   Specialty:  Radiology Contact information: [address] [id number] [place] [facility] Follow up.   Specialty:  Radiology Contact information: [address] [id number] [place] [phone]         No Known Allergies  Consultations:  Stroke neurologist, Dr. Erlinda Hong  Procedures/Studies: Ct Angio Head W Or Wo Contrast  Result Date: 07/10/2016 CLINICAL DATA:  Slurred speech and right facial droop. EXAM: CT ANGIOGRAPHY HEAD AND NECK TECHNIQUE: Multidetector CT imaging of the head and neck was performed using the standard protocol during bolus administration of intravenous contrast. Multiplanar CT image reconstructions and MIPs were obtained to evaluate the vascular anatomy. Carotid stenosis measurements (when applicable) are obtained utilizing NASCET criteria, using the distal internal carotid diameter as the  denominator. CONTRAST:  50 cc Isovue 370 COMPARISON:  Head CT 07/09/2016 FINDINGS: CTA NECK FINDINGS Aortic arch: Aortic atherosclerosis and tortuosity. No dissection. Branching pattern of the brachiocephalic vessels from the arch is normal without origin stenosis. Right carotid system: Common carotid artery widely patent to the bifurcation. Minimal atherosclerotic plaque at the carotid bifurcation but no stenosis. Cervical internal carotid artery is tortuous but widely patent. Left carotid system: Common carotid artery widely patent to the bifurcation region. Minimal atherosclerotic plaque at the carotid bifurcation but no stenosis. Cervical internal carotid artery is tortuous but widely patent. Vertebral arteries: Both vertebral artery origins are widely patent. Left vertebral artery is dominant. Both vertebral arteries are widely patent through the cervical region. Skeleton: Cervicothoracic spinal curvature and degenerative changes. Other neck: No mass or lymphadenopathy. Upper chest: Normal Review of the MIP images confirms the above findings CTA HEAD FINDINGS Anterior circulation: Both internal carotid arteries are patent through the skullbase and siphon regions. Anterior and middle cerebral vessels are normal without proximal stenosis, aneurysm or vascular malformation. No missing branch vessels identifiable. Posterior circulation: Both vertebral arteries are patent through the foramen magnum to the basilar. No basilar stenosis. Posterior circulation branch vessels are patent. I think there is mild stenotic disease in the right PCA territory. Venous sinuses: Patent and normal. Anatomic variants: None significant Delayed phase: No abnormal  enhancement Review of the MIP images confirms the above findings IMPRESSION: Mild atherosclerosis of the aortic arch and carotid bifurcation regions. No stenosis or irregularity. Tortuous vessels suggesting history of hypertension. No intracranial occlusion. I think there is  mild stenotic disease in the right PCA territory. Electronically Signed   By: Nelson Chimes M.D.   On: 07/10/2016 10:21   Ct Head Wo Contrast  Result Date: 07/09/2016 CLINICAL DATA:  Weakness EXAM: CT HEAD WITHOUT CONTRAST TECHNIQUE: Contiguous axial images were obtained from the base of the skull through the vertex without intravenous contrast. COMPARISON:  04/11/2014 FINDINGS: Brain: Mild atrophic changes. No findings to suggest acute hemorrhage, acute infarction or space-occupying mass lesion are noted. Few small lacunar infarcts are noted within the basal ganglia bilaterally. Vascular: No hyperdense vessel or unexpected calcification. Skull: Normal. Negative for fracture or focal lesion. Sinuses/Orbits: No acute finding. Other: None. IMPRESSION: Chronic changes without acute abnormality. Electronically Signed   By: Inez Catalina M.D.   On: 07/09/2016 17:47   Ct Angio Neck W Or Wo Contrast  Result Date: 07/10/2016 CLINICAL DATA:  Slurred speech and right facial droop. EXAM: CT ANGIOGRAPHY HEAD AND NECK TECHNIQUE: Multidetector CT imaging of the head and neck was performed using the standard protocol during bolus administration of intravenous contrast. Multiplanar CT image reconstructions and MIPs were obtained to evaluate the vascular anatomy. Carotid stenosis measurements (when applicable) are obtained utilizing NASCET criteria, using the distal internal carotid diameter as the denominator. CONTRAST:  50 cc Isovue 370 COMPARISON:  Head CT 07/09/2016 FINDINGS: CTA NECK FINDINGS Aortic arch: Aortic atherosclerosis and tortuosity. No dissection. Branching pattern of the brachiocephalic vessels from the arch is normal without origin stenosis. Right carotid system: Common carotid artery widely patent to the bifurcation. Minimal atherosclerotic plaque at the carotid bifurcation but no stenosis. Cervical internal carotid artery is tortuous but widely patent. Left carotid system: Common carotid artery widely patent  to the bifurcation region. Minimal atherosclerotic plaque at the carotid bifurcation but no stenosis. Cervical internal carotid artery is tortuous but widely patent. Vertebral arteries: Both vertebral artery origins are widely patent. Left vertebral artery is dominant. Both vertebral arteries are widely patent through the cervical region. Skeleton: Cervicothoracic spinal curvature and degenerative changes. Other neck: No mass or lymphadenopathy. Upper chest: Normal Review of the MIP images confirms the above findings CTA HEAD FINDINGS Anterior circulation: Both internal carotid arteries are patent through the skullbase and siphon regions. Anterior and middle cerebral vessels are normal without proximal stenosis, aneurysm or vascular malformation. No missing branch vessels identifiable. Posterior circulation: Both vertebral arteries are patent through the foramen magnum to the basilar. No basilar stenosis. Posterior circulation branch vessels are patent. I think there is mild stenotic disease in the right PCA territory. Venous sinuses: Patent and normal. Anatomic variants: None significant Delayed phase: No abnormal enhancement Review of the MIP images confirms the above findings IMPRESSION: Mild atherosclerosis of the aortic arch and carotid bifurcation regions. No stenosis or irregularity. Tortuous vessels suggesting history of hypertension. No intracranial occlusion. I think there is mild stenotic disease in the right PCA territory. Electronically Signed   By: Nelson Chimes M.D.   On: 07/10/2016 10:21   Mr Brain Wo Contrast  Result Date: 07/10/2016 CLINICAL DATA:  Episode of slurred speech and left-sided weakness today. EXAM: MRI HEAD WITHOUT CONTRAST TECHNIQUE: Multiplanar, multiecho pulse sequences of the brain and surrounding structures were obtained without intravenous contrast. COMPARISON:  CT and CT angiography. FINDINGS: Brain: Diffusion imaging does not show  any acute or subacute infarction. There  chronic small-vessel ischemic changes affecting the pons. 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 Cerebral hemispheres show chronic small-vessel ischemic change affecting the thalami, basal ganglia and hemispheric deep and subcortical white matter. No cortical or large vessel territory infarction. No mass lesion, hemorrhage, hydrocephalus or extra-axial collection. No pituitary mass. Vascular: Major vessels at the base of the brain show flow. Skull and upper cervical spine: Negative Sinuses/Orbits: Clear/normal Other: None significant IMPRESSION: No acute or subacute finding. Chronic small-vessel ischemic changes throughout the brain as outlined above. Electronically Signed   By: Nelson Chimes M.D.   On: 07/10/2016 11:25    Echocardiogram 07/10/2016:  Study Conclusions - Left ventricle: The cavity size was normal. Systolic function was   vigorous. The estimated ejection fraction was in the range of 65%   to 70%. Wall motion was normal; there were no regional wall   motion abnormalities. Doppler parameters are consistent with   abnormal left ventricular relaxation (grade 1 diastolic   dysfunction). Doppler parameters are consistent with elevated   ventricular end-diastolic filling pressure. - Aortic valve: There was moderate regurgitation. - Mitral valve: There was mild regurgitation. - Left atrium: The atrium was mildly dilated. - Pericardium, extracardiac: A mild pericardial effusion was   identified posterior to the heart.  Impressions: - Aortic valve leaflets are thickened, but poorly visualized, a   vegetation can't be excluded. There is moderate regurgitation. No   prior study is available for comparison. If clinically indicated   consider a TEE for further evaluation of the aortic valve.  Subjective: Symptoms resolved per pt. No weakness, numbness, speech abnormality, gait abnormality.   Discharge Exam: Vitals:   07/10/16 1433 07/10/16 1714  BP: (!) 153/55 (!)  141/60  Pulse: 70 68  Resp: 18 18  Temp: 98.6 F (37 C) 98 F (36.7 C)   General: Pt is alert, awake, not in acute distress Cardiovascular: RRR, S1/S2 +, no rubs, no gallops Respiratory: CTA bilaterally, no wheezing, no rhonchi Abdominal: Soft, NT, ND, bowel sounds + Extremities: No edema, no cyanosis Neuro: Alert, oriented, possible slight left facial droop. No other focal deficits in sensorimotor exam.   Labs: Basic Metabolic Panel:  Recent Labs Lab 07/09/16 1439 07/09/16 1453  NA 137 140  K 4.1 4.2  CL 104 101  CO2 26  --   GLUCOSE 99 95  BUN 15 18  CREATININE 1.10* 1.20*  CALCIUM 8.8*  --    Liver Function Tests:  Recent Labs Lab 07/09/16 1439  AST 21  ALT 20  ALKPHOS 79  BILITOT 0.5  PROT 6.7  ALBUMIN 3.5   CBC:  Recent Labs Lab 07/09/16 1439 07/09/16 1453  WBC 9.3  --   NEUTROABS 7.0  --   HGB 13.8 15.0  HCT 42.3 44.0  MCV 85.1  --   PLT 535*  --    Lipid Profile  Recent Labs  07/10/16 0526  CHOL 167  HDL 45  LDLCALC 105*  TRIG 84  CHOLHDL 3.7    Time coordinating discharge: Approximately 40 minutes  Vance Gather, MD  [facility] 07/11/2016, 3:17 PM Pager [phone]  If 7PM-7AM, please contact night-coverage [email or web address] Password [email or web address]

## 2016-07-10 NOTE — Progress Notes (Signed)
Pt ambulated from room to the nursing station with NT assistance. Pt denies any pain or discomfort. Pt's spouse said he believe she's back to her baseline. Delia Heady RN

## 2016-07-10 NOTE — Progress Notes (Signed)
STROKE TEAM PROGRESS NOTE   HISTORY OF PRESENT ILLNESS (per record) Donna Reed is a 71 y.o. female who was in her normal state of health until sometime afternoon, she is not certain of the exact time of onset but does state that it started after noon. Her husband came in from mowing around 1:20 PM and found her to be slurring her words. She was brought into the emergency department, but by the time of arrival around 2:40pm, her symptoms had improved.  She states that her left-sided weakness completely improved. She feels that she is currently back to baseline.   LKW: Noon tpa given?: no, mild symptoms   SUBJECTIVE (INTERVAL HISTORY) Her husband is at the bedside.  Pt stated that she is at her baseline. At baseline she has slurry speech and psychomotor slowing due to schizophrenia and medication. She admitted that she has frequent HA, about 3-4 per week. And this time her symptoms also associated with severe HA.    OBJECTIVE Temp:  [98 F (36.7 C)-98.6 F (37 C)] 98 F (36.7 C) (06/03 0600) Pulse Rate:  [58-76] 72 (06/03 0600) Cardiac Rhythm: Normal sinus rhythm (06/03 0700) Resp:  [16-22] 18 (06/03 0600) BP: (122-204)/(59-75) 159/59 (06/03 0600) SpO2:  [94 %-99 %] 97 % (06/03 0600) Weight:  [68.9 kg (152 lb)] 68.9 kg (152 lb) (06/02 1442)  CBC:   Recent Labs Lab 07/09/16 1439 07/09/16 1453  WBC 9.3  --   NEUTROABS 7.0  --   HGB 13.8 15.0  HCT 42.3 44.0  MCV 85.1  --   PLT 535*  --     Basic Metabolic Panel:   Recent Labs Lab 07/09/16 1439 07/09/16 1453  NA 137 140  K 4.1 4.2  CL 104 101  CO2 26  --   GLUCOSE 99 95  BUN 15 18  CREATININE 1.10* 1.20*  CALCIUM 8.8*  --     Lipid Panel:     Component Value Date/Time   CHOL 167 07/10/2016 0526   TRIG 84 07/10/2016 0526   HDL 45 07/10/2016 0526   CHOLHDL 3.7 07/10/2016 0526   VLDL 17 07/10/2016 0526   LDLCALC 105 (H) 07/10/2016 0526   HgbA1c: No results found for: HGBA1C Urine Drug Screen: No  results found for: LABOPIA, COCAINSCRNUR, LABBENZ, AMPHETMU, THCU, LABBARB  Alcohol Level No results found for: [place] I have personally reviewed the radiological images below and agree with the radiology interpretations.  Ct Head Wo Contrast 07/09/2016 Chronic changes without acute abnormality.   Ct Angio Head W Or Wo Contrast Ct Angio Neck W Or Wo Contrast 07/10/2016 Mild atherosclerosis of the aortic arch and carotid bifurcation regions. No stenosis or irregularity. Tortuous vessels suggesting history of hypertension. No intracranial occlusion. I think there is mild stenotic disease in the right PCA territory.   Mr Brain Wo Contrast 07/10/2016 No acute or subacute finding. Chronic small-vessel ischemic changes throughout the brain as outlined above.   TTE - pending   PHYSICAL EXAM  Temp:  [98 F (36.7 C)-98.6 F (37 C)] 98 F (36.7 C) (06/03 1714) Pulse Rate:  [68-85] 68 (06/03 1714) Resp:  [18] 18 (06/03 1714) BP: (141-200)/(55-76) 141/60 (06/03 1714) SpO2:  [94 %-98 %] 97 % (06/03 1714)  General - Well nourished, well developed, in no apparent distress.  Ophthalmologic - Fundi not visualized due to noncooperation.  Cardiovascular - Regular rate and rhythm.  Mental Status -  Level of arousal and orientation to place, day of the week  and person were intact, but not orientated to year or month. Language including expression, naming, repetition, comprehension was assessed and found intact. However, baseline dysarthria with psychomotor slowing  Cranial Nerves II - XII - II - Visual field intact OU. III, IV, VI - Extraocular movements intact. V - Facial sensation intact bilaterally. VII - Facial movement intact bilaterally. VIII - Hearing & vestibular intact bilaterally. X - Palate elevates symmetrically. XI - Chin turning & shoulder shrug intact bilaterally. XII - Tongue protrusion intact.  Motor Strength - The patient's strength was normal in all extremities and  pronator drift was absent.  Bulk was normal and fasciculations were absent.   Motor Tone - Muscle tone was assessed at the neck and appendages and was normal.  Reflexes - The patient's reflexes were 1+ in all extremities and she had no pathological reflexes.  Sensory - Light touch, temperature/pinprick were assessed and were symmetrical.    Coordination - The patient had normal movements in the hands with no ataxia or dysmetria.  Tremor was absent.  Gait and Station - deferred   ASSESSMENT/PLAN Ms. Donna Reed is a 71 y.o. female with history of paranoid schizophrenia, hypertension, hyperlipidemia, breast cancer, and depression presenting with transient left-sided weakness and speech difficulties - resolved.  She did not receive IV t-PA due to resolution of deficits.  Possible TIA vs. Complicated migraine    Resultant  Deficit resolved  CT head - no acute abnormality.  MRI head - no acute or subacute findings.  CTA H&N - Mild atherosclerosis of the aortic arch and carotid bifurcation regions.  2D Echo - pending  LDL - 105  HgbA1c - pending  VTE prophylaxis - SCDs Diet regular Room service appropriate? Yes; Fluid consistency: Thin  No antithrombotic prior to admission, now on aspirin 325 mg daily.   Patient counseled to be compliant with her antithrombotic medications  Ongoing aggressive stroke risk factor management  Therapy recommendations: none  Disposition: Pending  Migraine   Average 3-4 episodes per week  Takes ASA 650mg  for HA, but not daily  No neuro symptoms associated with HA in the past  This time left sided numbness weakness with severe HA  Will continue ASA on discharge  Refer to Dr. Jaynee Eagles for follow up at [facility]. She does not have neurologist in the past.  Hypertension  Stable  Permissive hypertension (OK if < 220/120) but gradually normalize in 5-7 days  Long-term BP goal normotensive  Hyperlipidemia  Home meds: Lipitor 20 mg daily  prior to admission  LDL 105, goal < 70  Now on Lipitor 40 mg daily  Continue statin at discharge  Other Stroke Risk Factors  Advanced age  Other Active Problems  Creatinine - 1.20  Schizophrenia - on haldol and effexor  Hospital day # 0  Neurology will sign off. Please call with questions. Pt will follow up with Dr. Jaynee Eagles at [facility] in about 6 weeks. Thanks for the consult.  Rosalin Hawking, MD PhD Stroke Neurology 07/11/2016 12:47 AM    To contact Stroke Continuity provider, please refer to [email or web address] After hours, contact General Neurology

## 2016-07-10 NOTE — Progress Notes (Signed)
Pt discharge education and instructions completed with pt and spouse at bedside; both voice understanding and denies any questions. IV and telemetry removed. Pt to pick up electronically sent prescriptions for aspirin and metoprolol which Dr. Bonner Puna was paged about and he said he was going to send it to her pharmacy on file. Pt discharge home with spouse to transport her home. Pt transported off unit via wheelchair with spouse and belongings to the side. Delia Heady RN

## 2016-07-11 LAB — ECHOCARDIOGRAM COMPLETE
Height: 66 in
WEIGHTICAEL: 2432 [oz_av]

## 2016-07-11 LAB — HEMOGLOBIN A1C
Hgb A1c MFr Bld: 5.7 % — ABNORMAL HIGH (ref 4.8–5.6)
Mean Plasma Glucose: 117 mg/dL

## 2016-09-28 ENCOUNTER — Ambulatory Visit (INDEPENDENT_AMBULATORY_CARE_PROVIDER_SITE_OTHER): Payer: Medicare Other | Admitting: Neurology

## 2016-09-28 ENCOUNTER — Encounter: Payer: Self-pay | Admitting: Neurology

## 2016-09-28 VITALS — BP 177/79 | HR 54 | Ht 65.0 in | Wt 152.8 lb

## 2016-09-28 DIAGNOSIS — F419 Anxiety disorder, unspecified: Secondary | ICD-10-CM

## 2016-09-28 DIAGNOSIS — G451 Carotid artery syndrome (hemispheric): Secondary | ICD-10-CM

## 2016-09-28 DIAGNOSIS — G44209 Tension-type headache, unspecified, not intractable: Secondary | ICD-10-CM

## 2016-09-28 NOTE — Patient Instructions (Signed)
-   continue ASA and lipitor for stroke prevetion - continue follow up with psychiatrist for medication management. - follow up with psychologist for counseling - Follow up with your primary care physician for stroke risk factor modification. Recommend maintain blood pressure goal <130/80, diabetes with hemoglobin A1c goal below 7.0% and lipids with LDL cholesterol goal below 70 mg/dL.  - check BP at home and record and bring over to PCP for medication adjustment if needed - relaxation and more active will help in long run - healthy diet and regular exercise - follow up as needed.

## 2016-09-28 NOTE — Progress Notes (Signed)
STROKE NEUROLOGY FOLLOW UP NOTE  NAME: Donna Reed DOB: [birth year]  REASON FOR VISIT: stroke follow up HISTORY FROM: husband and chart  Today we had the pleasure of seeing Donna Reed in follow-up at our Neurology Clinic. Pt was accompanied by husband.   History Summary Donna Reed is a 71 y.o. female with history of paranoid schizophrenia, hypertension, hyperlipidemia, breast cancer, migraine, anxiety and depression admitted on 07/09/16 for transient left-sided weakness and speech difficulties. Episode companied with severe HA. CT head and MRI head no acute findings. She does have hx of migraine. CTA H&N with Mild atherosclerosis of the aortic arch and carotid bifurcation regions. TTE EF 65-70%, LDL 105 and A1C 5.7. Symptoms considered possible TIA vs. Complicated migraine. She was discharged with ASA and lipitor.   Interval History During the interval time, the patient has been doing well. She is at her mental baseline. She still has slurry speech, restless movement, forced facial movement, chronic from her psychoactive medications including haldol, cogentin, ativan, effecxor. She is following with psychology and psychiatry. Mild HA at night, bifrontal, 3-4 per week but tylenol helps well, concerning for tension HA. BP at home 150s. Today in clinic 177/79. Husband stated that she was very nervous coming to clinic today.    REVIEW OF SYSTEMS: Full 14 system review of systems performed and notable only for those listed below and in HPI above, all others are negative:  Constitutional:   Cardiovascular:  Ear/Nose/Throat:  Hearing loss Skin:  Eyes:   Respiratory:   Gastroitestinal:   Genitourinary:  Hematology/Lymphatic:   Endocrine:  Musculoskeletal:  Back pain Allergy/Immunology:   Neurological:  HA Psychiatric: depression, nervous, anxiety Sleep: restless leg  The following represents the patient's updated allergies and side effects list: No Known Allergies  The  neurologically relevant items on the patient's problem list were reviewed on today's visit.  Neurologic Examination  A problem focused neurological exam (12 or more points of the single system neurologic examination, vital signs counts as 1 point, cranial nerves count for 8 points) was performed.  Blood pressure (!) 177/79, pulse (!) 54, height 5\' 5"  (1.651 m), weight 152 lb 12.8 oz (69.3 kg).  General - Well nourished, well developed, mildly anxious and restless.  Ophthalmologic - Fundi not visualized due to noncooperation  Cardiovascular - Regular rate and rhythm with no murmur.  Mental Status -  Level of arousal and orientation to place, and person were intact, not orientated to time. Language exam showed paucity of speech, answer questions use simple words, but able to name and repeat, slurry speech and teeth clenched when speaks. Psychomotor slowing. Attention span and concentration were impaired Fund of Knowledge was assessed and was impaired  Cranial Nerves II - XII - II - Visual field intact OU. III, IV, VI - Extraocular movements intact. V - Facial sensation intact bilaterally. VII - Facial movement intact bilaterally. VIII - Hearing & vestibular intact bilaterally. X - Palate elevates symmetrically. XI - Chin turning & shoulder shrug intact bilaterally. XII - Tongue protrusion intact.  Motor Strength - The patient's strength was symmetrical in all extremities and pronator drift was absent.  Bulk was normal and fasciculations were absent.   Motor Tone - Muscle tone was assessed at the neck and appendages and was normal.  Reflexes - The patient's reflexes were 1+ in all extremities and she had no pathological reflexes.  Sensory - Light touch, temperature/pinprick, vibration and proprioception, and Romberg testing were assessed and were normal.  Coordination - The patient had normal movements in the hands with no ataxia or dysmetria, but slow action.  Tremor was  absent.  Gait and Station - mild shuffling gait.   Functional score  mRS = 1   0 - No symptoms.   1 - No significant disability. Able to carry out all usual activities, despite some symptoms.   2 - Slight disability. Able to look after own affairs without assistance, but unable to carry out all previous activities.   3 - Moderate disability. Requires some help, but able to walk unassisted.   4 - Moderately severe disability. Unable to attend to own bodily needs without assistance, and unable to walk unassisted.   5 - Severe disability. Requires constant nursing care and attention, bedridden, incontinent.   6 - Dead.   NIH Stroke Scale   Level Of Consciousness 0=Alert; keenly responsive 1=Not alert, but arousable by minor stimulation 2=Not alert, requires repeated stimulation 3=Responds only with reflex movements 0  LOC Questions to Month and Age 71=Answers both questions correctly 1=Answers one question correctly 2=Answers neither question correctly 1  LOC Commands      -Open/Close eyes     -Open/close grip 0=Performs both tasks correctly 1=Performs one task correctly 2=Performs neighter task correctly 0  Best Gaze 0=Normal 1=Partial gaze palsy 2=Forced deviation, or total gaze paresis 0  Visual 0=No visual loss 1=Partial hemianopia 2=Complete hemianopia 3=Bilateral hemianopia (blind including cortical blindness) 0  Facial Palsy 0=Normal symmetrical movement 1=Minor paralysis (asymmetry) 2=Partial paralysis (lower face) 3=Complete paralysis (upper and lower face) 0  Motor  0=No drift, limb holds posture for full 10 seconds 1=Drift, limb holds posture, no drift to bed 2=Some antigravity effort, cannot maintain posture, drifts to bed 3=No effort against gravity, limb falls 4=No movement Right Arm 0     Leg 0    Left Arm 0     Leg 0  Limb Ataxia 0=Absent 1=Present in one limb 2=Present in two limbs 0  Sensory 0=Normal 1=Mild to moderate sensory  loss 2=Severe to total sensory loss 0  Best Language 0=No aphasia, normal 1=Mild to moderate aphasia 2=Mute, global aphasia 3=Mute, global aphasia 0  Dysarthria 0=Normal 1=Mild to moderate 2=Severe, unintelligible or mute/anarthric 1  Extinction/Neglect 0=No abnormality 1=Extinction to bilateral simultaneous stimulation 2=Profound neglect 0  Total   2     Data reviewed: I personally reviewed the images and agree with the radiology interpretations.  Ct Head Wo Contrast 07/09/2016 Chronic changes without acute abnormality.   Ct Angio Head W Or Wo Contrast Ct Angio Neck W Or Wo Contrast 07/10/2016 Mild atherosclerosis of the aortic arch and carotid bifurcation regions. No stenosis or irregularity. Tortuous vessels suggesting history of hypertension. No intracranial occlusion. I think there is mild stenotic disease in the right PCA territory.   Mr Brain Wo Contrast 07/10/2016 No acute or subacute finding. Chronic small-vessel ischemic changes throughout the brain as outlined above.   TTE Aortic valve leaflets are thickened, but poorly visualized, a   vegetation can&'t be excluded. There is moderate regurgitation. No   prior study is available for comparison. If clinically indicated   consider a TEE for further evaluation of the aortic valve.  Component     Latest Ref Rng & Units 07/10/2016  Cholesterol     0 - 200 mg/dL 167  Triglycerides     <150 mg/dL 84  HDL Cholesterol     >40 mg/dL 45  Total CHOL/HDL Ratio     RATIO 3.7  VLDL     0 - 40 mg/dL 17  LDL (calc)     0 - 99 mg/dL 105 (H)  Hemoglobin A1C     4.8 - 5.6 % 5.7 (H)  Mean Plasma Glucose     mg/dL 117    Assessment: As you may recall, she is a 71 y.o. Caucasian female with PMH of paranoid schizophrenia, hypertension, hyperlipidemia, breast cancer, migraine and depression admitted on 07/09/16 for transient left-sided weakness and speech difficulties. Episode companied with severe HA. CT head and MRI head no acute  findings. She does have hx of migraine. CTA H&N with Mild atherosclerosis of the aortic arch and carotid bifurcation regions. TTE EF 65-70%, LDL 105 and A1C 5.7. Symptoms considered possible TIA vs. Complicated migraine vs. anxiety. She was discharged with ASA and lipitor. During the interval time, pt is doing well at her mental baseline.   Plan:  - continue ASA and lipitor for stroke prevetion - continue follow up with psychiatrist for medication management. - follow up with psychologist for counseling - Follow up with your primary care physician for stroke risk factor modification. Recommend maintain blood pressure goal <130/80, diabetes with hemoglobin A1c goal below 7.0% and lipids with LDL cholesterol goal below 70 mg/dL.  - check BP at home and record and bring over to PCP for medication adjustment if needed - relaxation and de-stress and more active  - healthy diet and regular exercise - follow up as needed.   I spent more than 25 minutes of face to face time with the patient. Greater than 50% of time was spent in counseling and coordination of care. We discussed etiology of the symptoms leading to admission, continue follow up with psychiatry and psychology   No orders of the defined types were placed in this encounter.   Meds ordered this encounter  Medications  . DISCONTD: metoprolol tartrate (LOPRESSOR) 25 MG tablet    Sig: Take 25 mg by mouth.    Patient Instructions  - continue ASA and lipitor for stroke prevetion - continue follow up with psychiatrist for medication management. - follow up with psychologist for counseling - Follow up with your primary care physician for stroke risk factor modification. Recommend maintain blood pressure goal <130/80, diabetes with hemoglobin A1c goal below 7.0% and lipids with LDL cholesterol goal below 70 mg/dL.  - check BP at home and record and bring over to PCP for medication adjustment if needed - relaxation and more active will help  in long run - healthy diet and regular exercise - follow up as needed.    Rosalin Hawking, MD PhD [facility] [address], [place], [place] [address] [phone]

## 2016-09-29 DIAGNOSIS — G44209 Tension-type headache, unspecified, not intractable: Secondary | ICD-10-CM | POA: Insufficient documentation

## 2016-09-29 DIAGNOSIS — F419 Anxiety disorder, unspecified: Secondary | ICD-10-CM | POA: Insufficient documentation

## 2017-12-26 ENCOUNTER — Other Ambulatory Visit (HOSPITAL_COMMUNITY): Payer: Self-pay | Admitting: Psychiatry

## 2018-05-11 IMAGING — MR MR HEAD W/O CM
9 of 10 series · 37 of 48 positions shown · non-contrast
Comparison: CT and CT angiography.

CLINICAL DATA: Episode of slurred speech and left-sided weakness
today.

EXAM:
MRI HEAD WITHOUT CONTRAST
TECHNIQUE: Multiplanar, multiecho pulse sequences of the brain and surrounding
structures were obtained without intravenous contrast.

[Series 3: T1 · sagittal · 5.0mm · 0.47mm/px · 3 of 23 slices shown]
[im 1/23]
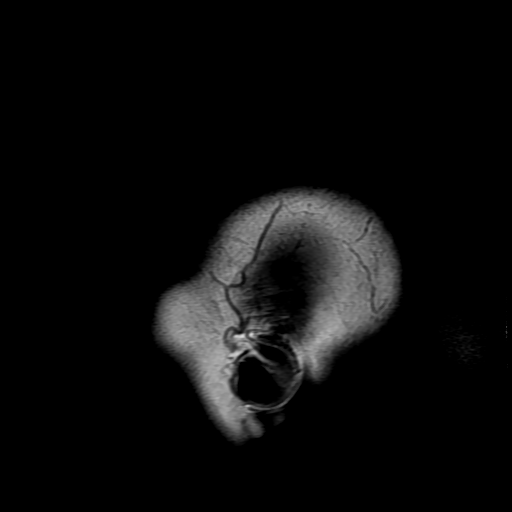
[im 12/23]
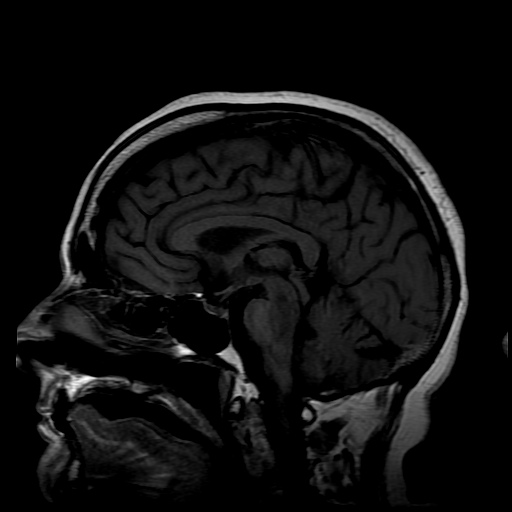
[im 23/23]
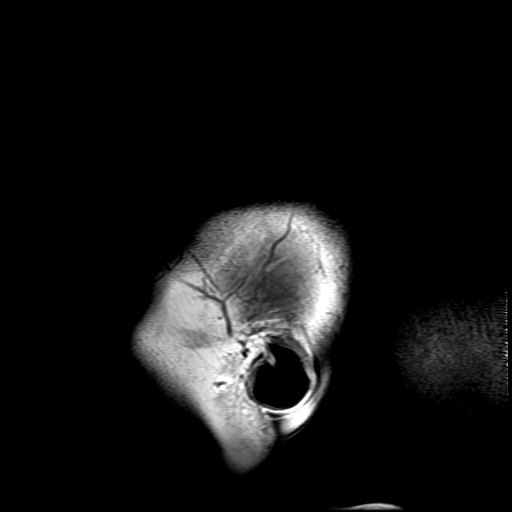

[Series 4: DWI · axial · 3.0mm · 1.09mm/px · z∈[-55,+83]mm · 9 of 94 slices shown (1 of 4)]
[im 1/94]
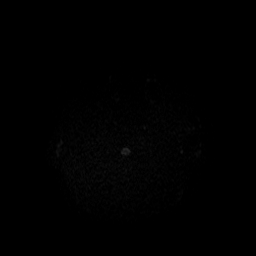
[im 11/94]
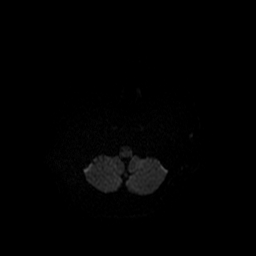
[im 21/94]
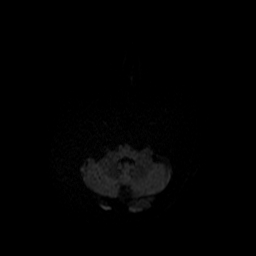
[im 32/94]
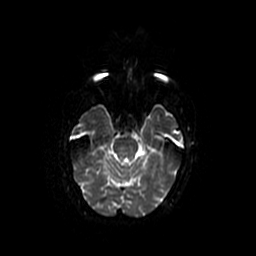
[im 42/94]
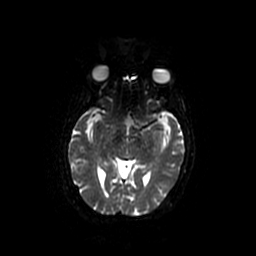
[im 52/94]
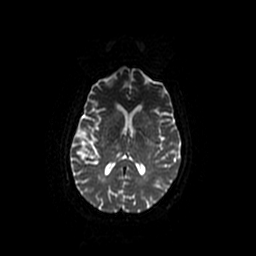
[im 63/94]
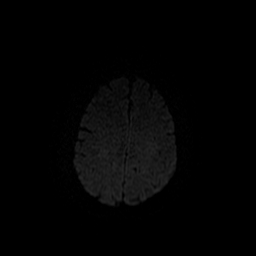
[im 83/94]
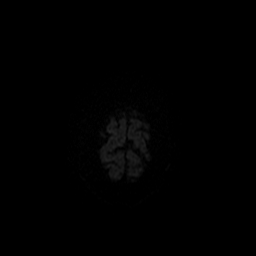
[im 94/94]
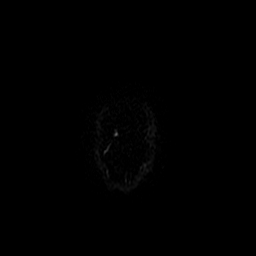

[Series 5: T2 · axial · 5.0mm · 0.43mm/px · z∈[-57,+80]mm · 2 of 24 slices shown (1 of 2)]
[im 1/24]
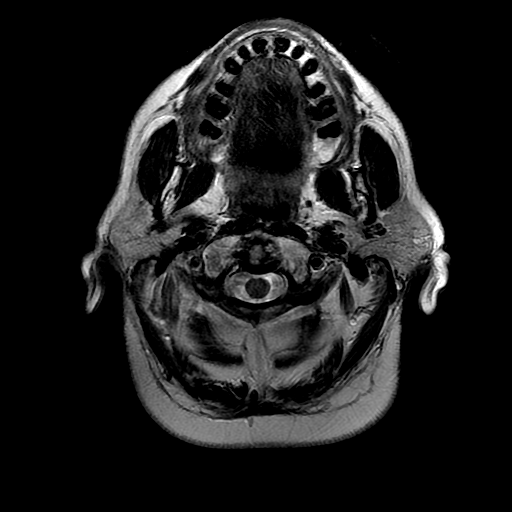
[im 24/24]
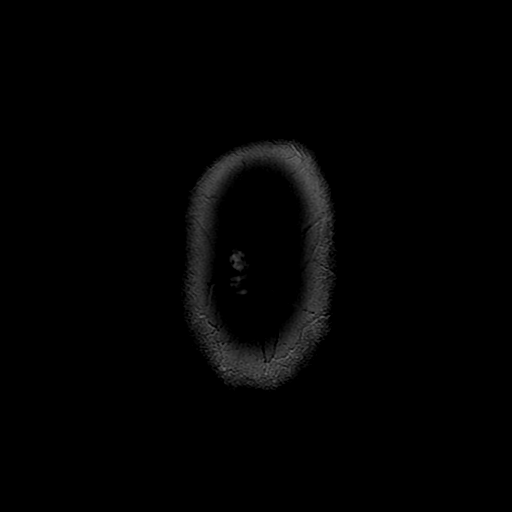

[Series 6: DWI · coronal · 5.0mm · 1.09mm/px · 7 of 68 slices shown (2 of 4)]
[im 1/68]
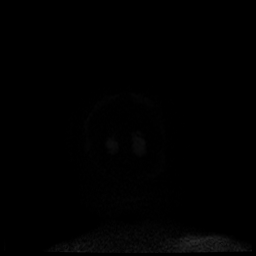
[im 12/68]
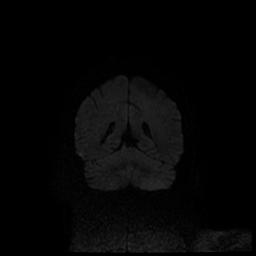
[im 23/68]
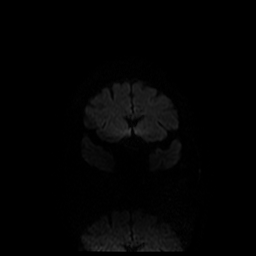
[im 34/68]
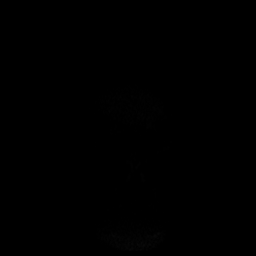
[im 45/68]
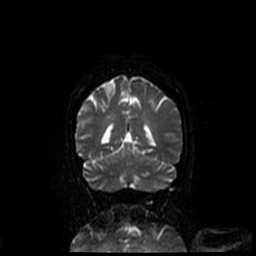
[im 56/68]
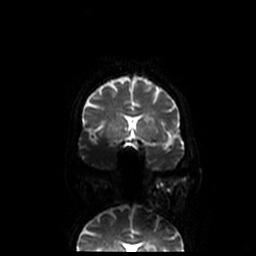
[im 68/68]
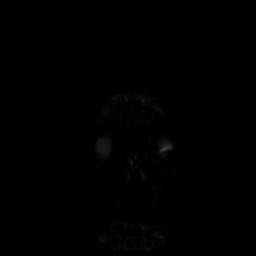

[Series 7: FLAIR · axial · 5.0mm · 0.43mm/px · z∈[-57,+80]mm · 2 of 24 slices shown]
[im 1/24]
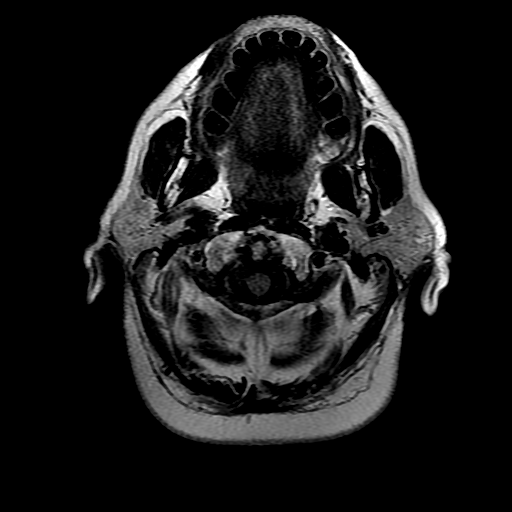
[im 24/24]
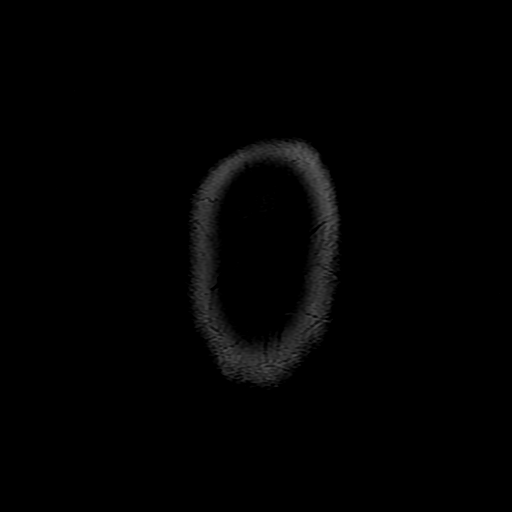

[Series 8: ax mpgr · axial · 5.0mm · 0.43mm/px · z∈[-57,+80]mm · 2 of 24 slices shown]
[im 1/24]
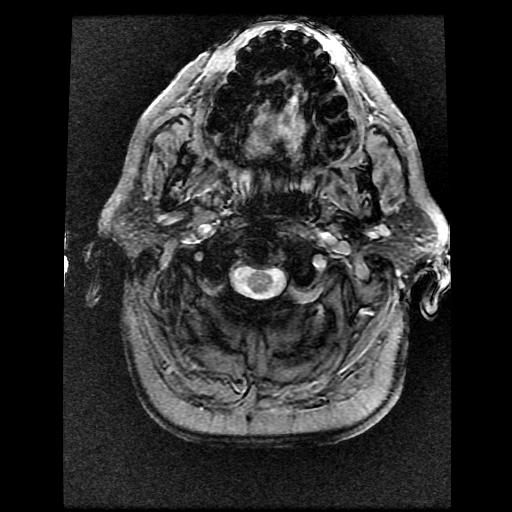
[im 24/24]
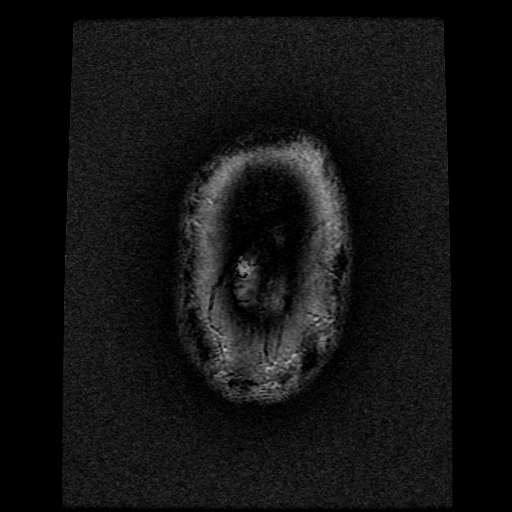

[Series 10: T2 · coronal · 5.0mm · 0.43mm/px · 3 of 30 slices shown (2 of 2)]
[im 1/30]
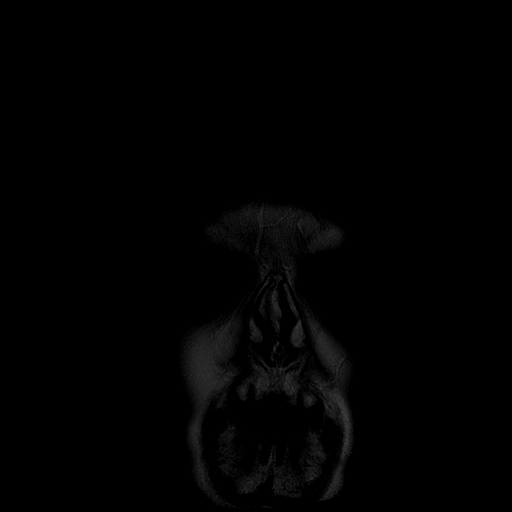
[im 15/30]
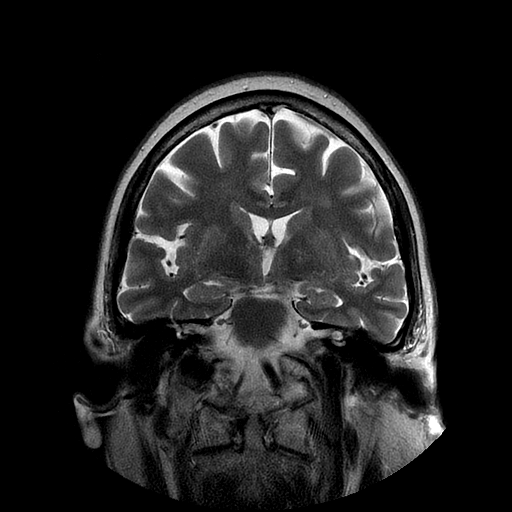
[im 30/30]
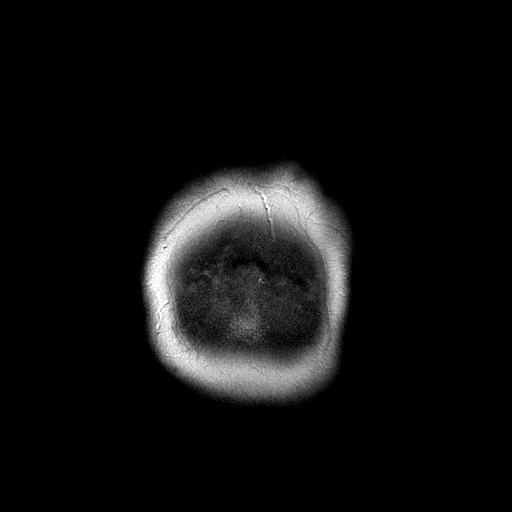

[Series 400: DWI · axial · 3.0mm · 1.09mm/px · z∈[-55,+83]mm · 5 of 47 slices shown (3 of 4)]
[im 1/47]
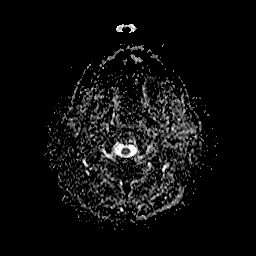
[im 12/47]
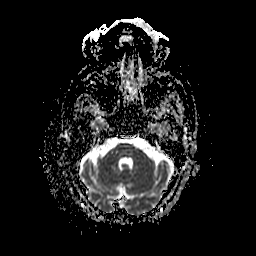
[im 24/47]
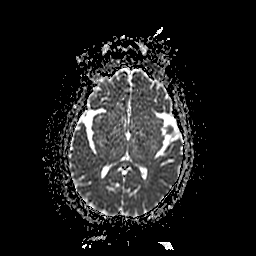
[im 35/47]
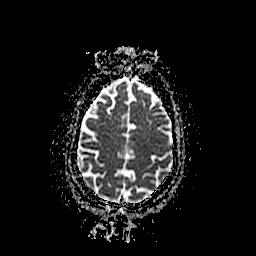
[im 47/47]
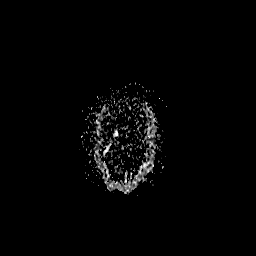

[Series 600: DWI · coronal · 5.0mm · 1.09mm/px · 4 of 34 slices shown (4 of 4)]
[im 1/34]
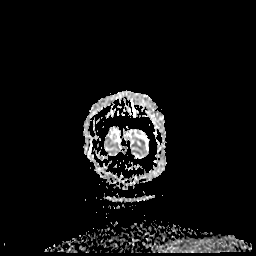
[im 12/34]
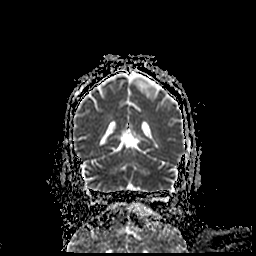
[im 23/34]
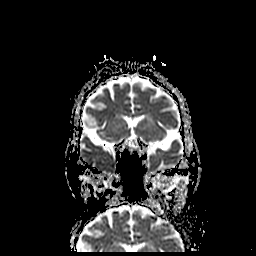
[im 34/34]
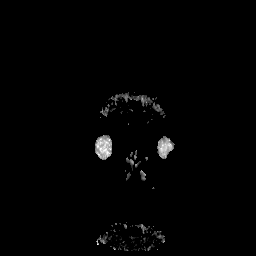

[37 of 48 positions shown; findings below may reference images not displayed]

FINDINGS: Brain: Diffusion imaging does not show any acute or subacute
infarction. There chronic small-vessel ischemic changes affecting
the pons. There are a few old small vessel cerebellar infarctions.
Cerebral hemispheres show chronic small-vessel ischemic change
affecting the thalami, basal ganglia and hemispheric deep and
subcortical white matter. No cortical or large vessel territory
infarction. No mass lesion, hemorrhage, hydrocephalus or extra-axial
collection. No pituitary mass.

Vascular: Major vessels at the base of the brain show flow.

Skull and upper cervical spine: Negative

Sinuses/Orbits: Clear/normal

Other: None significant
IMPRESSION: No acute or subacute finding. Chronic small-vessel ischemic changes
throughout the brain as outlined above.
